# Patient Record
Sex: Male | Born: 1975 | Race: White | Hispanic: No | Marital: Married | Smoking: Current every day smoker
Health system: Southern US, Community
[De-identification: ages and names within clinical notes are randomized; demographics above are authoritative.]

---

## 2005-11-17 ENCOUNTER — Emergency Department (HOSPITAL_COMMUNITY): Admission: EM | Admit: 2005-11-17 | Discharge: 2005-11-18 | Payer: Self-pay | Admitting: Emergency Medicine

## 2006-07-13 ENCOUNTER — Encounter: Admission: RE | Admit: 2006-07-13 | Discharge: 2006-07-13 | Payer: Self-pay | Admitting: Orthopedic Surgery

## 2009-07-24 ENCOUNTER — Ambulatory Visit (HOSPITAL_BASED_OUTPATIENT_CLINIC_OR_DEPARTMENT_OTHER): Admission: RE | Admit: 2009-07-24 | Discharge: 2009-07-24 | Payer: Self-pay | Admitting: Orthopedic Surgery

## 2010-08-19 LAB — POCT HEMOGLOBIN-HEMACUE: Hemoglobin: 16.7 g/dL (ref 13.0–17.0)

## 2016-12-21 ENCOUNTER — Emergency Department (HOSPITAL_COMMUNITY)
Admission: EM | Admit: 2016-12-21 | Discharge: 2016-12-21 | Disposition: A | Discharge status: Home / Self Care | Payer: BLUE CROSS/BLUE SHIELD | Attending: Emergency Medicine | Admitting: Emergency Medicine

## 2016-12-21 ENCOUNTER — Encounter (HOSPITAL_COMMUNITY): Payer: Self-pay | Admitting: Emergency Medicine

## 2016-12-21 ENCOUNTER — Emergency Department (HOSPITAL_COMMUNITY): Payer: BLUE CROSS/BLUE SHIELD

## 2016-12-21 DIAGNOSIS — R1030 Lower abdominal pain, unspecified: Secondary | ICD-10-CM | POA: Diagnosis present

## 2016-12-21 DIAGNOSIS — F17200 Nicotine dependence, unspecified, uncomplicated: Secondary | ICD-10-CM | POA: Diagnosis not present

## 2016-12-21 DIAGNOSIS — K529 Noninfective gastroenteritis and colitis, unspecified: Secondary | ICD-10-CM | POA: Insufficient documentation

## 2016-12-21 DIAGNOSIS — R102 Pelvic and perineal pain: Secondary | ICD-10-CM | POA: Diagnosis not present

## 2016-12-21 LAB — URINALYSIS, ROUTINE W REFLEX MICROSCOPIC
Bilirubin Urine: NEGATIVE
Glucose, UA: NEGATIVE mg/dL
Hgb urine dipstick: NEGATIVE
Ketones, ur: NEGATIVE mg/dL
Leukocytes, UA: NEGATIVE
NITRITE: NEGATIVE
PH: 7 (ref 5.0–8.0)
Protein, ur: NEGATIVE mg/dL
SPECIFIC GRAVITY, URINE: 1.008 (ref 1.005–1.030)

## 2016-12-21 LAB — CBC
HEMATOCRIT: 46.6 % (ref 39.0–52.0)
HEMOGLOBIN: 17.1 g/dL — AB (ref 13.0–17.0)
MCH: 31.5 pg (ref 26.0–34.0)
MCHC: 36.7 g/dL — ABNORMAL HIGH (ref 30.0–36.0)
MCV: 85.8 fL (ref 78.0–100.0)
Platelets: 167 10*3/uL (ref 150–400)
RBC: 5.43 MIL/uL (ref 4.22–5.81)
RDW: 12.4 % (ref 11.5–15.5)
WBC: 10.3 10*3/uL (ref 4.0–10.5)

## 2016-12-21 LAB — COMPREHENSIVE METABOLIC PANEL
ALBUMIN: 3.6 g/dL (ref 3.5–5.0)
ALK PHOS: 56 U/L (ref 38–126)
ALT: 38 U/L (ref 17–63)
ANION GAP: 10 (ref 5–15)
AST: 23 U/L (ref 15–41)
BUN: 7 mg/dL (ref 6–20)
CALCIUM: 9.3 mg/dL (ref 8.9–10.3)
CO2: 29 mmol/L (ref 22–32)
Chloride: 100 mmol/L — ABNORMAL LOW (ref 101–111)
Creatinine, Ser: 0.95 mg/dL (ref 0.61–1.24)
GFR calc non Af Amer: >60 mL/min (ref >60)
Glucose, Bld: 123 mg/dL — ABNORMAL HIGH (ref 65–99)
POTASSIUM: 3.7 mmol/L (ref 3.5–5.1)
SODIUM: 139 mmol/L (ref 135–145)
Total Bilirubin: 0.6 mg/dL (ref 0.3–1.2)
Total Protein: 6.2 g/dL — ABNORMAL LOW (ref 6.5–8.1)

## 2016-12-21 LAB — LIPASE, BLOOD: LIPASE: 60 U/L — AB (ref 11–51)

## 2016-12-21 MED ORDER — CIPROFLOXACIN HCL 500 MG PO TABS
500.0000 mg | ORAL_TABLET | Freq: Once | ORAL | Status: AC
  Administered 2016-12-21: 500 mg via ORAL
  Filled 2016-12-21: qty 1

## 2016-12-21 MED ORDER — MORPHINE SULFATE (PF) 4 MG/ML IV SOLN
4.0000 mg | Freq: Once | INTRAVENOUS | Status: AC
  Administered 2016-12-21: 4 mg via INTRAVENOUS
  Filled 2016-12-21: qty 1

## 2016-12-21 MED ORDER — ONDANSETRON HCL 4 MG/2ML IJ SOLN
4.0000 mg | Freq: Once | INTRAMUSCULAR | Status: AC
  Administered 2016-12-21: 4 mg via INTRAVENOUS
  Filled 2016-12-21: qty 2

## 2016-12-21 MED ORDER — SODIUM CHLORIDE 0.9 % IV BOLUS (SEPSIS)
1000.0000 mL | Freq: Once | INTRAVENOUS | Status: AC
  Administered 2016-12-21: 1000 mL via INTRAVENOUS

## 2016-12-21 MED ORDER — METRONIDAZOLE 500 MG PO TABS
500.0000 mg | ORAL_TABLET | Freq: Once | ORAL | Status: AC
  Administered 2016-12-21: 500 mg via ORAL
  Filled 2016-12-21: qty 1

## 2016-12-21 MED ORDER — METRONIDAZOLE 500 MG PO TABS: 500.0000 mg | ORAL_TABLET | Freq: Three times a day (TID) | ORAL | 0 refills | Status: AC

## 2016-12-21 MED ORDER — ONDANSETRON 4 MG PO TBDP: ORAL_TABLET | ORAL | 0 refills | Status: AC

## 2016-12-21 MED ORDER — ONDANSETRON 4 MG PO TBDP
4.0000 mg | ORAL_TABLET | Freq: Once | ORAL | Status: AC
  Administered 2016-12-21: 4 mg via ORAL
  Filled 2016-12-21: qty 1

## 2016-12-21 MED ORDER — IOPAMIDOL (ISOVUE-300) INJECTION 61%
INTRAVENOUS | Status: AC
  Administered 2016-12-21: 100 mL via INTRAVENOUS
  Filled 2016-12-21: qty 100

## 2016-12-21 MED ORDER — HYDROCODONE-ACETAMINOPHEN 5-325 MG PO TABS: ORAL_TABLET | ORAL | 0 refills | Status: AC

## 2016-12-21 MED ORDER — CIPROFLOXACIN HCL 500 MG PO TABS: 500.0000 mg | ORAL_TABLET | Freq: Two times a day (BID) | ORAL | 0 refills | Status: AC

## 2016-12-21 NOTE — ED Provider Notes (Signed)
[facility]-EMERGENCY DEPT Provider Note   CSN: [id number] Arrival date & time: 12/21/16  [id number]     History   Chief Complaint Chief Complaint  Patient presents with  . Abdominal Pain  . Diarrhea     HPI   Blood pressure 131/86, pulse 72, temperature 98.2 F (36.8 C), temperature source Oral, resp. rate 17, SpO2 100 %.  Frederick Ritter is a 41 y.o. male complaining of moderate-to-severe lower abdominal pain onset 4 days ago. Described as colicky, unclear if it's worse on the right than the left. It's associated with the profuse watery diarrhea, with nausea but without emesis, fever (he does report chills.). Patient states the stool turned darker after he started taking Pepto-Bismol, patient also started taking Excedrin for pain control. He is not an excessive drinker and does not normally take NSAIDs. He has no epigastric abdominal pain. He's never seen a gastroenterologist and has never had a colonoscopy. Positive anorexia. Of note, patient's family received a phone call stating that [employer] goldfish that they thought may have been contaminated with salmonella, it was requested that the remaining goldfish. Return to them however they had artery been eaten. Both this patient and his son had eaten them, son is sick with diarrhea and emesis.   History reviewed. No pertinent past medical history.  There are no active problems to display for this patient.   History reviewed. No pertinent surgical history.     Home Medications    Prior to Admission medications   Medication Sig Start Date End Date Taking? Authorizing Provider  aspirin-acetaminophen-caffeine (EXCEDRIN MIGRAINE) [phone] MG tablet Take 2 tablets by mouth every 6 (six) hours as needed for headache.   Yes [provider]  bismuth subsalicylate (PEPTO BISMOL) 262 MG chewable tablet Chew 524 mg by mouth as needed for indigestion.   Yes [provider]  ciprofloxacin (CIPRO) 500 MG tablet Take 1 tablet (500  mg total) by mouth every 12 (twelve) hours. 12/21/16   Celicia Minahan, Joni ReiningNicole, PA-C  HYDROcodone-acetaminophen (NORCO/VICODIN) 5-325 MG tablet Take 1-2 tablets by mouth every 6 hours as needed for pain. 12/21/16   Shawntell Dixson, Joni ReiningNicole, PA-C  metroNIDAZOLE (FLAGYL) 500 MG tablet Take 1 tablet (500 mg total) by mouth 3 (three) times daily. 12/21/16 12/31/16  Aeralyn Barna, Joni ReiningNicole, PA-C  ondansetron (ZOFRAN ODT) 4 MG disintegrating tablet 4mg  ODT q4 hours prn nausea/vomit 12/21/16   Stpehen Petitjean, Joni ReiningNicole, PA-C    Family History No family history on file.  Social History Social History  Substance Use Topics  . Smoking status: Current Every Day Smoker  . Smokeless tobacco: Current User  . Alcohol use Yes     Allergies   Patient has no known allergies.   Review of Systems Review of Systems  A complete review of systems was obtained and all systems are negative except as noted in the HPI and PMH.    Physical Exam Updated Vital Signs BP 115/76 (BP Location: Right Arm)   Pulse 63   Temp 98.2 F (36.8 C) (Oral)   Resp 16   SpO2 99%   Physical Exam  Constitutional: He is oriented to person, place, and time. He appears well-developed and well-nourished. No distress.  HENT:  Head: Normocephalic and atraumatic.  Mouth/Throat: Oropharynx is clear and moist.  Eyes: Pupils are equal, round, and reactive to light. Conjunctivae and EOM are normal.  Neck: Normal range of motion.  Cardiovascular: Normal rate, regular rhythm and intact distal pulses.   Pulmonary/Chest: Effort normal and breath sounds normal. No respiratory  distress. He has no wheezes. He has no rales. He exhibits no tenderness.  Abdominal: Soft. He exhibits no distension and no mass. There is tenderness. There is no rebound and no guarding. No hernia.  Normal active bowel sounds.   Tender to palpation the left lower quadrant, worse at removal of pressure. Rovsing, psoas, obturator negative. No CVA tenderness to percussion bilaterally.    Musculoskeletal: Normal range of motion.  Neurological: He is alert and oriented to person, place, and time.  Skin: Capillary refill takes less than 2 seconds. He is not diaphoretic.  Psychiatric: He has a normal mood and affect.  Nursing note and vitals reviewed.    ED Treatments / Results  Labs (all labs ordered are listed, but only abnormal results are displayed) Labs Reviewed  LIPASE, BLOOD - Abnormal; Notable for the following:       Result Value   Lipase 60 (*)    All other components within normal limits  COMPREHENSIVE METABOLIC PANEL - Abnormal; Notable for the following:    Chloride 100 (*)    Glucose, Bld 123 (*)    Total Protein 6.2 (*)    All other components within normal limits  CBC - Abnormal; Notable for the following:    Hemoglobin 17.1 (*)    MCHC 36.7 (*)    All other components within normal limits  GASTROINTESTINAL PANEL BY PCR, STOOL (REPLACES STOOL CULTURE)  URINALYSIS, ROUTINE W REFLEX MICROSCOPIC    EKG  EKG Interpretation None       Radiology Ct Abdomen Pelvis W Contrast  Result Date: 12/21/2016 CLINICAL DATA:  Acute pelvic pain. EXAM: CT ABDOMEN AND PELVIS WITH CONTRAST TECHNIQUE: Multidetector CT imaging of the abdomen and pelvis was performed using the standard protocol following bolus administration of intravenous contrast. CONTRAST:  100mL ISOVUE-300 IOPAMIDOL (ISOVUE-300) INJECTION 61% COMPARISON:  None. FINDINGS: Lower chest: No acute abnormality. Hepatobiliary: No focal liver abnormality is seen. No gallstones, gallbladder wall thickening, or biliary dilatation. Pancreas: Unremarkable. No pancreatic ductal dilatation or surrounding inflammatory changes. Spleen: Normal in size without focal abnormality. Adrenals/Urinary Tract: Adrenal glands are unremarkable. Kidneys are normal, without renal calculi, focal lesion, or hydronephrosis. Bladder is unremarkable. Stomach/Bowel: Mild wall thickening is seen involving the distal small bowel with  enhancement, most consistent with enteritis or other bowel inflammation. Crohn's disease cannot be excluded. The appendix appears normal. The stomach appears normal. Possible mild wall thickening and minimal surrounding inflammatory changes of the colon is noted suggesting colitis. There is no evidence of bowel obstruction. Vascular/Lymphatic: No significant vascular findings are present. No enlarged abdominal or pelvic lymph nodes. Reproductive: Prostate is unremarkable. Other: Small amount of free fluid is noted in the dependent portion of the pelvis. No hernia is noted. Musculoskeletal: No acute or significant osseous findings. IMPRESSION: Mild wall thickening and enhancement is seen involving the distal small bowel, most consistent with enteritis or possibly inflammatory bowel disease such as Crohn's disease. Possible mild wall thickening of the colon with minimal surrounding inflammatory changes is also noted suggesting inflammatory or infectious colitis. Electronically Signed   By: Lupita RaiderJames  Green Jr, M.D.   On: 12/21/2016 14:45    Procedures Procedures (including critical care time)  Medications Ordered in ED Medications  ondansetron (ZOFRAN-ODT) disintegrating tablet 4 mg (not administered)  ciprofloxacin (CIPRO) tablet 500 mg (not administered)  metroNIDAZOLE (FLAGYL) tablet 500 mg (not administered)  sodium chloride 0.9 % bolus 1,000 mL (0 mLs Intravenous Stopped 12/21/16 1509)  morphine 4 MG/ML injection 4 mg (4 mg  Intravenous Given 12/21/16 1330)  ondansetron (ZOFRAN) injection 4 mg (4 mg Intravenous Given 12/21/16 1326)  iopamidol (ISOVUE-300) 61 % injection (100 mLs Intravenous Contrast Given 12/21/16 1410)     Initial Impression / Assessment and Plan / ED Course  I have reviewed the triage vital signs and the nursing notes.  Pertinent labs & imaging results that were available during my care of the patient were reviewed by me and considered in my medical decision making (see chart for  details).     Vitals:   12/21/16 1345 12/21/16 1400 12/21/16 1430 12/21/16 1510  BP: 119/75 127/78  115/76  Pulse: 72 74 69 63  Resp:    16  Temp:      TempSrc:      SpO2: 100% 99% 100% 99%    Medications  ondansetron (ZOFRAN-ODT) disintegrating tablet 4 mg (not administered)  ciprofloxacin (CIPRO) tablet 500 mg (not administered)  metroNIDAZOLE (FLAGYL) tablet 500 mg (not administered)  sodium chloride 0.9 % bolus 1,000 mL (0 mLs Intravenous Stopped 12/21/16 1509)  morphine 4 MG/ML injection 4 mg (4 mg Intravenous Given 12/21/16 1330)  ondansetron (ZOFRAN) injection 4 mg (4 mg Intravenous Given 12/21/16 1326)  iopamidol (ISOVUE-300) 61 % injection (100 mLs Intravenous Contrast Given 12/21/16 1410)    Frederick Ritter is 41 y.o. male presenting with bilateral lower abdominal pain with associated watery diarrhea  onset 4 days ago while he was traveling. Abdominal exam is nonsurgical, he is tender in both the right and left lower quadrant. Blood work reassuring with no leukocytosis and left hemoconcentrated. Nonspecific elevation in lipase.  CT abdomen pelvis shows a nonspecific inflammation in the distal small bowel and colon. Will cover for an infectious colitis with Cipro and Flagyl, this will also cover Salmonella. Patient's stool by PCR pending. Will be given GI referral, extensive discussion of return precautions.  Evaluation does not show pathology that would require ongoing emergent intervention or inpatient treatment. Pt is hemodynamically stable and mentating appropriately. Discussed findings and plan with patient/guardian, who agrees with care plan. All questions answered. Return precautions discussed and outpatient follow up given.    Final Clinical Impressions(s) / ED Diagnoses   Final diagnoses:  Colitis    New Prescriptions New Prescriptions   CIPROFLOXACIN (CIPRO) 500 MG TABLET    Take 1 tablet (500 mg total) by mouth every 12 (twelve) hours.   HYDROCODONE-ACETAMINOPHEN  (NORCO/VICODIN) 5-325 MG TABLET    Take 1-2 tablets by mouth every 6 hours as needed for pain.   METRONIDAZOLE (FLAGYL) 500 MG TABLET    Take 1 tablet (500 mg total) by mouth 3 (three) times daily.   ONDANSETRON (ZOFRAN ODT) 4 MG DISINTEGRATING TABLET    4mg  ODT q4 hours prn nausea/vomit     Madasyn Heath, Mardella Layman 12/21/16 1539    Charlynne Pander, MD 12/22/16 [phone]

## 2016-12-21 NOTE — ED Notes (Signed)
Pt and wife state they undestand insatructions. Home stable with wife.

## 2016-12-21 NOTE — ED Notes (Signed)
Pt unable to provide stool specimen

## 2016-12-21 NOTE — ED Triage Notes (Signed)
Pt. Stated, I've had stomach pain lower , I will have periods of sharpe pain that spike and let off.  Ive been nauseated with diarrhea straight water.  No appetite.  This started Thursday.

## 2016-12-21 NOTE — Discharge Instructions (Signed)
You can maintain a full liquid diet for 3 days if you choose to.  Take vicodin for breakthrough pain, do not drink alcohol, drive, care for children or do other critical tasks while taking vicodin.  Take your antibiotics as directed and to completion. You should never have any leftover antibiotics! Push fluids and stay well hydrated.   Do not drink alcohol while you are taking flagyl (metronidazole) because it will make you very sick.  Please take probiotics that you can get over-the-counter, asked your pharmacist for recommendations if needed.

## 2016-12-24 ENCOUNTER — Other Ambulatory Visit: Payer: Self-pay | Admitting: Gastroenterology

## 2016-12-24 DIAGNOSIS — K529 Noninfective gastroenteritis and colitis, unspecified: Secondary | ICD-10-CM

## 2017-01-28 ENCOUNTER — Ambulatory Visit
Admission: RE | Admit: 2017-01-28 | Discharge: 2017-01-28 | Disposition: A | Discharge status: Home / Self Care | Payer: BLUE CROSS/BLUE SHIELD | Source: Ambulatory Visit | Attending: Gastroenterology | Admitting: Gastroenterology

## 2017-01-28 DIAGNOSIS — K529 Noninfective gastroenteritis and colitis, unspecified: Secondary | ICD-10-CM

## 2017-01-28 MED ORDER — IOPAMIDOL (ISOVUE-300) INJECTION 61%
100.0000 mL | Freq: Once | INTRAVENOUS | Status: AC | PRN
  Administered 2017-01-28: 100 mL via INTRAVENOUS

## 2017-02-03 DIAGNOSIS — K529 Noninfective gastroenteritis and colitis, unspecified: Secondary | ICD-10-CM | POA: Diagnosis not present

## 2017-02-03 DIAGNOSIS — R194 Change in bowel habit: Secondary | ICD-10-CM | POA: Diagnosis not present

## 2017-03-17 DIAGNOSIS — D2230 Melanocytic nevi of unspecified part of face: Secondary | ICD-10-CM | POA: Diagnosis not present

## 2017-03-17 DIAGNOSIS — L281 Prurigo nodularis: Secondary | ICD-10-CM | POA: Diagnosis not present

## 2017-03-17 DIAGNOSIS — Z86018 Personal history of other benign neoplasm: Secondary | ICD-10-CM | POA: Diagnosis not present

## 2017-03-17 DIAGNOSIS — D2261 Melanocytic nevi of right upper limb, including shoulder: Secondary | ICD-10-CM | POA: Diagnosis not present

## 2017-03-17 DIAGNOSIS — D485 Neoplasm of uncertain behavior of skin: Secondary | ICD-10-CM | POA: Diagnosis not present

## 2017-03-17 DIAGNOSIS — D225 Melanocytic nevi of trunk: Secondary | ICD-10-CM | POA: Diagnosis not present

## 2018-03-11 DIAGNOSIS — E559 Vitamin D deficiency, unspecified: Secondary | ICD-10-CM | POA: Diagnosis not present

## 2018-03-11 DIAGNOSIS — Z125 Encounter for screening for malignant neoplasm of prostate: Secondary | ICD-10-CM | POA: Diagnosis not present

## 2018-03-11 DIAGNOSIS — Z0000 Encounter for general adult medical examination without abnormal findings: Secondary | ICD-10-CM | POA: Diagnosis not present

## 2018-03-18 DIAGNOSIS — G4709 Other insomnia: Secondary | ICD-10-CM | POA: Diagnosis not present

## 2018-03-18 DIAGNOSIS — R030 Elevated blood-pressure reading, without diagnosis of hypertension: Secondary | ICD-10-CM | POA: Diagnosis not present

## 2018-03-18 DIAGNOSIS — Z0000 Encounter for general adult medical examination without abnormal findings: Secondary | ICD-10-CM | POA: Diagnosis not present

## 2018-03-18 DIAGNOSIS — Z720 Tobacco use: Secondary | ICD-10-CM | POA: Diagnosis not present

## 2018-03-18 DIAGNOSIS — R5383 Other fatigue: Secondary | ICD-10-CM | POA: Diagnosis not present

## 2018-03-18 DIAGNOSIS — Z1389 Encounter for screening for other disorder: Secondary | ICD-10-CM | POA: Diagnosis not present

## 2018-03-22 DIAGNOSIS — D2261 Melanocytic nevi of right upper limb, including shoulder: Secondary | ICD-10-CM | POA: Diagnosis not present

## 2018-03-22 DIAGNOSIS — L281 Prurigo nodularis: Secondary | ICD-10-CM | POA: Diagnosis not present

## 2018-03-22 DIAGNOSIS — Z23 Encounter for immunization: Secondary | ICD-10-CM | POA: Diagnosis not present

## 2018-03-22 DIAGNOSIS — D225 Melanocytic nevi of trunk: Secondary | ICD-10-CM | POA: Diagnosis not present

## 2018-03-22 DIAGNOSIS — D485 Neoplasm of uncertain behavior of skin: Secondary | ICD-10-CM | POA: Diagnosis not present

## 2018-12-17 IMAGING — CT CT ABD-PELV W/ CM
2 of 5 series · 16 of 46 positions shown, 18 images · IV contrast (APPLIED)
Comparison: None.

CLINICAL DATA: Acute pelvic pain.

EXAM:
CT ABDOMEN AND PELVIS WITH CONTRAST
TECHNIQUE: Multidetector CT imaging of the abdomen and pelvis was performed
using the standard protocol following bolus administration of
intravenous contrast.
CONTRAST:  100mL [id number] IOPAMIDOL (B4O4PU-E33) INJECTION 61%

[Series 3: abdomen 5.0 · axial · 0.80mm/px · z∈[+735,+1155]mm · 13 of 99 slices shown, 15 images]
[im 8/99  soft-tissue]
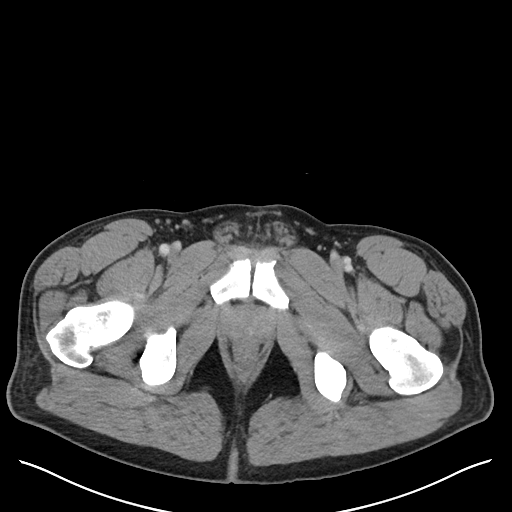
[im 8/99  bone]
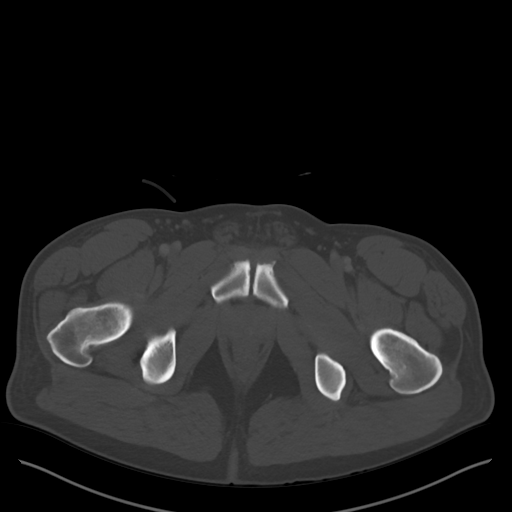
[im 15/99  soft-tissue]
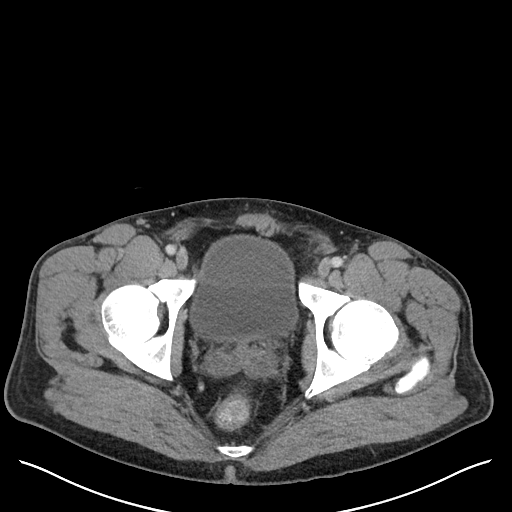
[im 22/99  soft-tissue]
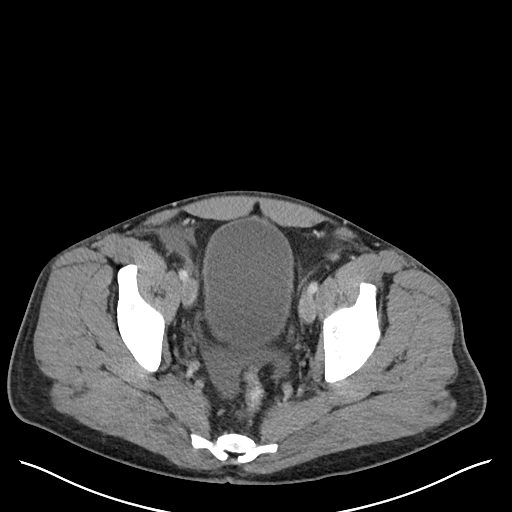
[im 29/99  soft-tissue]
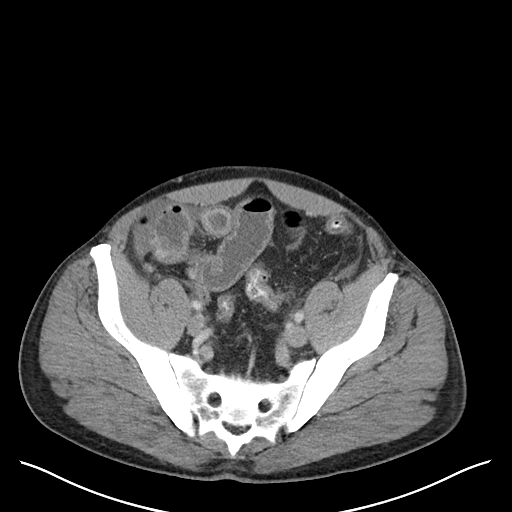
[im 36/99  soft-tissue]
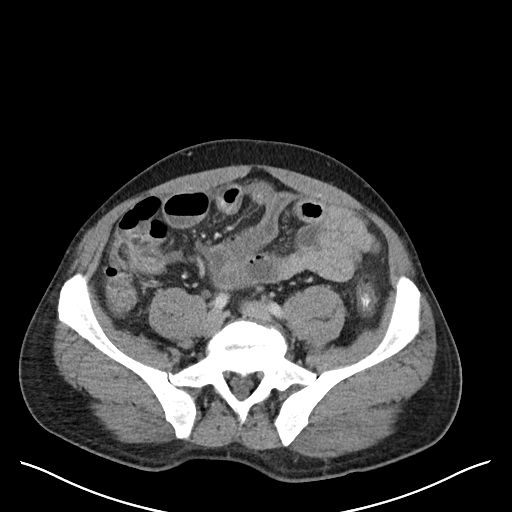
[im 43/99  soft-tissue]
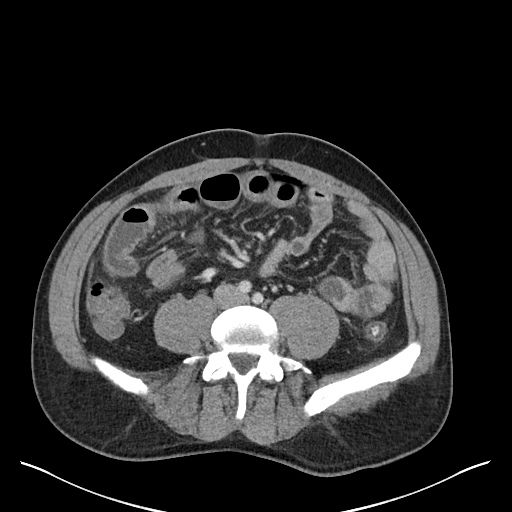
[im 50/99  soft-tissue]
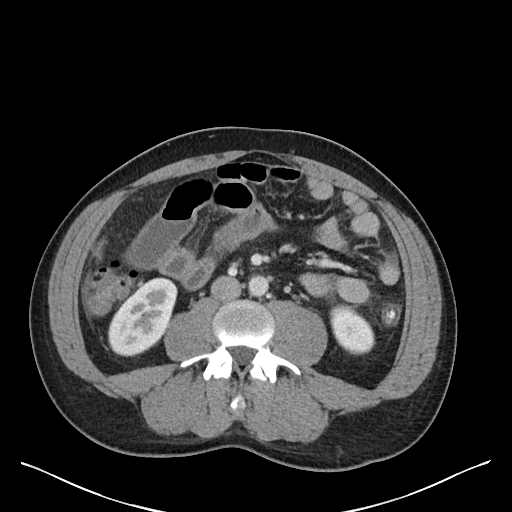
[im 57/99  soft-tissue]
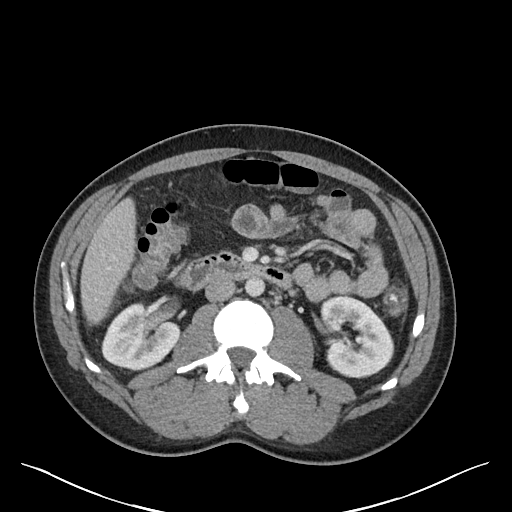
[im 64/99  soft-tissue]
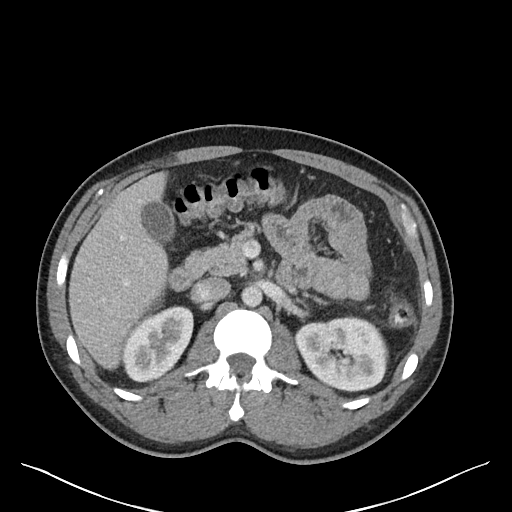
[im 64/99  bone]
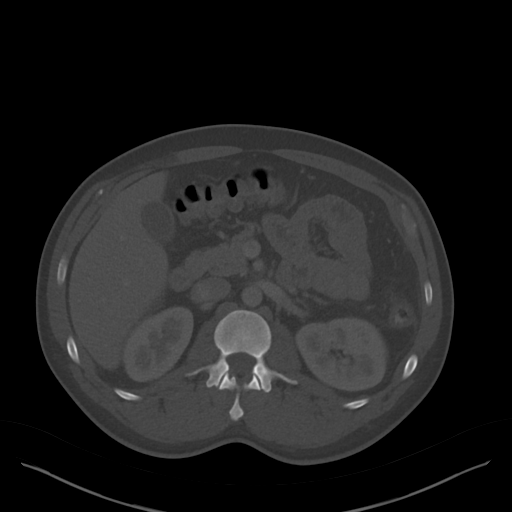
[im 71/99  soft-tissue]
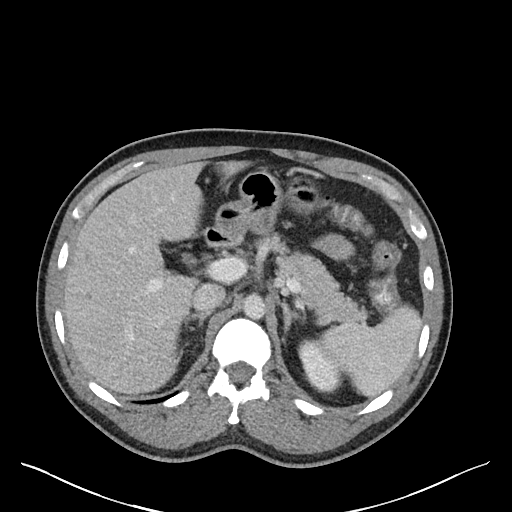
[im 78/99  soft-tissue]
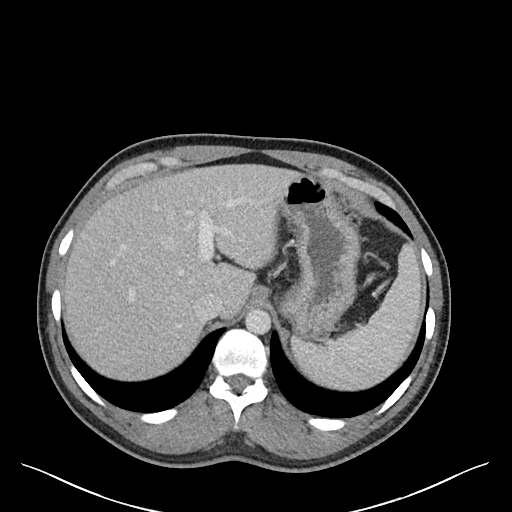
[im 85/99  soft-tissue]
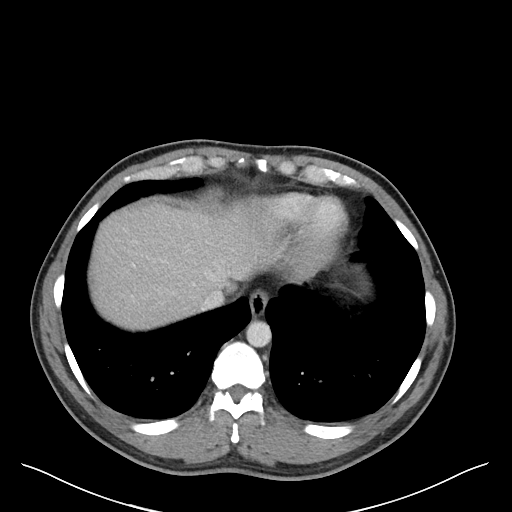
[im 92/99  soft-tissue]
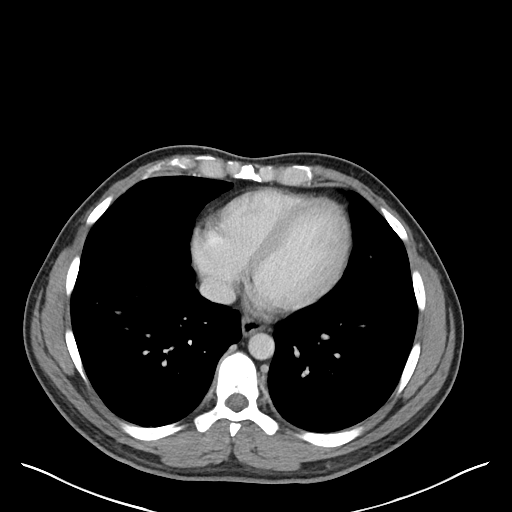

[Series 6: abdomen 3.0 mpr cor · coronal · 0.91mm/px · 3 of 100 slices shown]
[im 34/100  soft-tissue]
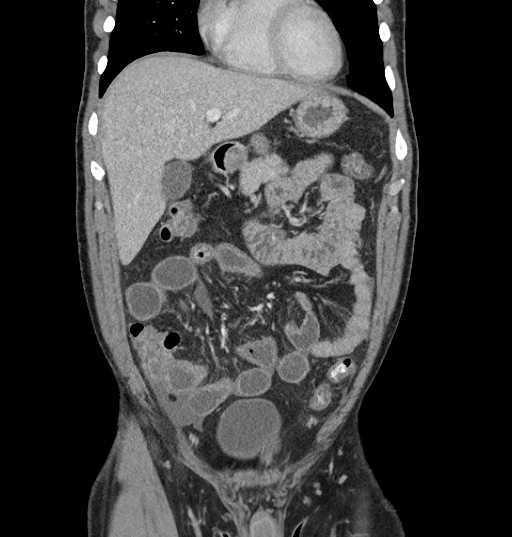
[im 45/100  soft-tissue]
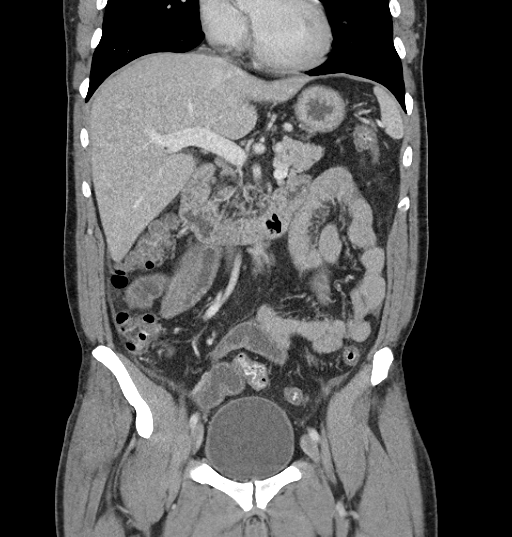
[im 56/100  soft-tissue]
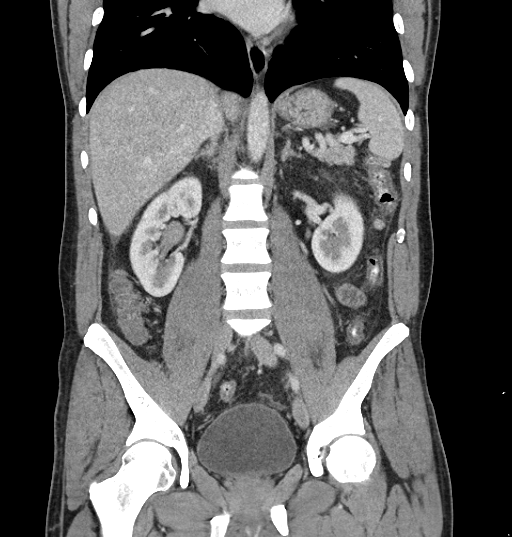

[16 of 46 positions shown; findings below may reference images not displayed]

FINDINGS: Lower chest: No acute abnormality.

Hepatobiliary: No focal liver abnormality is seen. No gallstones,
gallbladder wall thickening, or biliary dilatation.

Pancreas: Unremarkable. No pancreatic ductal dilatation or
surrounding inflammatory changes.

Spleen: Normal in size without focal abnormality.

Adrenals/Urinary Tract: Adrenal glands are unremarkable. Kidneys are
normal, without renal calculi, focal lesion, or hydronephrosis.
Bladder is unremarkable.

Stomach/Bowel: Mild wall thickening is seen involving the distal
small bowel with enhancement, most consistent with enteritis or
other bowel inflammation. Crohn's disease cannot be excluded. The
appendix appears normal. The stomach appears normal. Possible mild
wall thickening and minimal surrounding inflammatory changes of the
colon is noted suggesting colitis. There is no evidence of bowel
obstruction.

Vascular/Lymphatic: No significant vascular findings are present. No
enlarged abdominal or pelvic lymph nodes.

Reproductive: Prostate is unremarkable.

Other: Small amount of free fluid is noted in the dependent portion
of the pelvis. No hernia is noted.

Musculoskeletal: No acute or significant osseous findings.
IMPRESSION: Mild wall thickening and enhancement is seen involving the distal
small bowel, most consistent with enteritis or possibly inflammatory
bowel disease such as Crohn's disease. Possible mild wall thickening
of the colon with minimal surrounding inflammatory changes is also
noted suggesting inflammatory or infectious colitis.

## 2019-05-24 ENCOUNTER — Ambulatory Visit: Payer: BC Managed Care – PPO | Attending: Internal Medicine

## 2019-05-24 DIAGNOSIS — Z20822 Contact with and (suspected) exposure to covid-19: Secondary | ICD-10-CM

## 2019-05-24 DIAGNOSIS — Z20828 Contact with and (suspected) exposure to other viral communicable diseases: Secondary | ICD-10-CM | POA: Diagnosis not present

## 2019-05-25 LAB — NOVEL CORONAVIRUS, NAA: SARS-CoV-2, NAA: NOT DETECTED

## 2019-06-27 DIAGNOSIS — T1501XA Foreign body in cornea, right eye, initial encounter: Secondary | ICD-10-CM | POA: Diagnosis not present

## 2019-07-01 DIAGNOSIS — T1501XD Foreign body in cornea, right eye, subsequent encounter: Secondary | ICD-10-CM | POA: Diagnosis not present

## 2020-03-07 DIAGNOSIS — Z0000 Encounter for general adult medical examination without abnormal findings: Secondary | ICD-10-CM | POA: Diagnosis not present

## 2020-03-07 DIAGNOSIS — E782 Mixed hyperlipidemia: Secondary | ICD-10-CM | POA: Diagnosis not present

## 2020-03-07 DIAGNOSIS — R5383 Other fatigue: Secondary | ICD-10-CM | POA: Diagnosis not present

## 2020-06-28 DIAGNOSIS — D485 Neoplasm of uncertain behavior of skin: Secondary | ICD-10-CM | POA: Diagnosis not present

## 2020-06-28 DIAGNOSIS — L281 Prurigo nodularis: Secondary | ICD-10-CM | POA: Diagnosis not present

## 2020-06-28 DIAGNOSIS — L720 Epidermal cyst: Secondary | ICD-10-CM | POA: Diagnosis not present

## 2020-08-29 DIAGNOSIS — E785 Hyperlipidemia, unspecified: Secondary | ICD-10-CM | POA: Diagnosis not present

## 2020-08-29 DIAGNOSIS — E559 Vitamin D deficiency, unspecified: Secondary | ICD-10-CM | POA: Diagnosis not present

## 2020-08-29 DIAGNOSIS — Z0000 Encounter for general adult medical examination without abnormal findings: Secondary | ICD-10-CM | POA: Diagnosis not present

## 2020-08-29 DIAGNOSIS — Z125 Encounter for screening for malignant neoplasm of prostate: Secondary | ICD-10-CM | POA: Diagnosis not present

## 2020-09-03 DIAGNOSIS — R82998 Other abnormal findings in urine: Secondary | ICD-10-CM | POA: Diagnosis not present

## 2020-09-03 DIAGNOSIS — R899 Unspecified abnormal finding in specimens from other organs, systems and tissues: Secondary | ICD-10-CM | POA: Diagnosis not present

## 2020-09-03 DIAGNOSIS — R030 Elevated blood-pressure reading, without diagnosis of hypertension: Secondary | ICD-10-CM | POA: Diagnosis not present

## 2020-09-03 DIAGNOSIS — Z0000 Encounter for general adult medical examination without abnormal findings: Secondary | ICD-10-CM | POA: Diagnosis not present

## 2020-09-03 DIAGNOSIS — N529 Male erectile dysfunction, unspecified: Secondary | ICD-10-CM | POA: Diagnosis not present

## 2020-09-03 DIAGNOSIS — R5383 Other fatigue: Secondary | ICD-10-CM | POA: Diagnosis not present

## 2020-10-04 DIAGNOSIS — D2262 Melanocytic nevi of left upper limb, including shoulder: Secondary | ICD-10-CM | POA: Diagnosis not present

## 2020-10-04 DIAGNOSIS — D485 Neoplasm of uncertain behavior of skin: Secondary | ICD-10-CM | POA: Diagnosis not present

## 2020-10-04 DIAGNOSIS — L578 Other skin changes due to chronic exposure to nonionizing radiation: Secondary | ICD-10-CM | POA: Diagnosis not present

## 2020-10-04 DIAGNOSIS — D225 Melanocytic nevi of trunk: Secondary | ICD-10-CM | POA: Diagnosis not present

## 2021-01-01 DIAGNOSIS — D485 Neoplasm of uncertain behavior of skin: Secondary | ICD-10-CM | POA: Diagnosis not present

## 2021-01-01 DIAGNOSIS — D2262 Melanocytic nevi of left upper limb, including shoulder: Secondary | ICD-10-CM | POA: Diagnosis not present

## 2021-04-29 ENCOUNTER — Other Ambulatory Visit: Payer: Self-pay | Admitting: Family Medicine

## 2021-04-29 DIAGNOSIS — R1011 Right upper quadrant pain: Secondary | ICD-10-CM | POA: Diagnosis not present

## 2021-05-07 ENCOUNTER — Ambulatory Visit
Admission: RE | Admit: 2021-05-07 | Discharge: 2021-05-07 | Disposition: A | Discharge status: Home / Self Care | Payer: BC Managed Care – PPO | Source: Ambulatory Visit | Attending: Family Medicine | Admitting: Family Medicine

## 2021-05-07 DIAGNOSIS — K8020 Calculus of gallbladder without cholecystitis without obstruction: Secondary | ICD-10-CM | POA: Diagnosis not present

## 2021-05-07 DIAGNOSIS — R1011 Right upper quadrant pain: Secondary | ICD-10-CM

## 2021-05-07 DIAGNOSIS — K824 Cholesterolosis of gallbladder: Secondary | ICD-10-CM | POA: Diagnosis not present

## 2021-05-24 DIAGNOSIS — K8010 Calculus of gallbladder with chronic cholecystitis without obstruction: Secondary | ICD-10-CM | POA: Diagnosis not present

## 2021-05-24 DIAGNOSIS — K824 Cholesterolosis of gallbladder: Secondary | ICD-10-CM | POA: Diagnosis not present

## 2021-06-21 DIAGNOSIS — K811 Chronic cholecystitis: Secondary | ICD-10-CM | POA: Diagnosis not present

## 2021-06-21 DIAGNOSIS — K8010 Calculus of gallbladder with chronic cholecystitis without obstruction: Secondary | ICD-10-CM | POA: Diagnosis not present

## 2021-10-04 DIAGNOSIS — D2261 Melanocytic nevi of right upper limb, including shoulder: Secondary | ICD-10-CM | POA: Diagnosis not present

## 2021-10-04 DIAGNOSIS — D225 Melanocytic nevi of trunk: Secondary | ICD-10-CM | POA: Diagnosis not present

## 2021-10-04 DIAGNOSIS — D485 Neoplasm of uncertain behavior of skin: Secondary | ICD-10-CM | POA: Diagnosis not present

## 2021-10-04 DIAGNOSIS — L905 Scar conditions and fibrosis of skin: Secondary | ICD-10-CM | POA: Diagnosis not present

## 2021-10-04 DIAGNOSIS — D2222 Melanocytic nevi of left ear and external auricular canal: Secondary | ICD-10-CM | POA: Diagnosis not present

## 2021-10-04 DIAGNOSIS — L578 Other skin changes due to chronic exposure to nonionizing radiation: Secondary | ICD-10-CM | POA: Diagnosis not present

## 2022-03-27 DIAGNOSIS — L988 Other specified disorders of the skin and subcutaneous tissue: Secondary | ICD-10-CM | POA: Diagnosis not present

## 2022-03-27 DIAGNOSIS — D485 Neoplasm of uncertain behavior of skin: Secondary | ICD-10-CM | POA: Diagnosis not present

## 2022-10-14 DIAGNOSIS — L720 Epidermal cyst: Secondary | ICD-10-CM | POA: Diagnosis not present

## 2022-10-14 DIAGNOSIS — D225 Melanocytic nevi of trunk: Secondary | ICD-10-CM | POA: Diagnosis not present

## 2022-10-14 DIAGNOSIS — D485 Neoplasm of uncertain behavior of skin: Secondary | ICD-10-CM | POA: Diagnosis not present

## 2022-10-14 DIAGNOSIS — L578 Other skin changes due to chronic exposure to nonionizing radiation: Secondary | ICD-10-CM | POA: Diagnosis not present

## 2022-10-14 DIAGNOSIS — L821 Other seborrheic keratosis: Secondary | ICD-10-CM | POA: Diagnosis not present

## 2022-10-14 DIAGNOSIS — L281 Prurigo nodularis: Secondary | ICD-10-CM | POA: Diagnosis not present

## 2023-03-23 DIAGNOSIS — R030 Elevated blood-pressure reading, without diagnosis of hypertension: Secondary | ICD-10-CM | POA: Diagnosis not present

## 2023-04-28 DIAGNOSIS — E785 Hyperlipidemia, unspecified: Secondary | ICD-10-CM | POA: Diagnosis not present

## 2023-04-28 DIAGNOSIS — Z125 Encounter for screening for malignant neoplasm of prostate: Secondary | ICD-10-CM | POA: Diagnosis not present

## 2023-04-28 DIAGNOSIS — E559 Vitamin D deficiency, unspecified: Secondary | ICD-10-CM | POA: Diagnosis not present

## 2023-05-03 IMAGING — US US ABDOMEN LIMITED
1 series · 14 of 25 positions shown · non-contrast
Comparison: CT 01/28/2017

CLINICAL DATA: Right upper quadrant pain

EXAM:
ULTRASOUND ABDOMEN LIMITED RIGHT UPPER QUADRANT

[Series 1: us abdomen limited · 0.25mm/px · 14 of 94 slices shown]
[im 1/94]
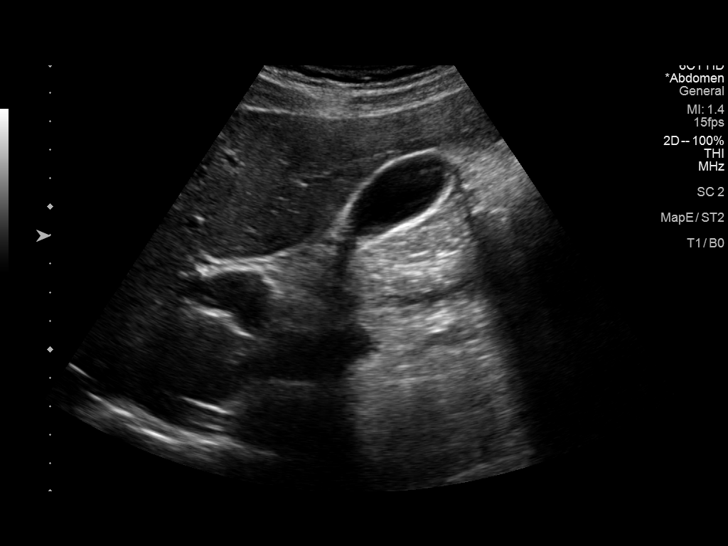
[im 8/94]
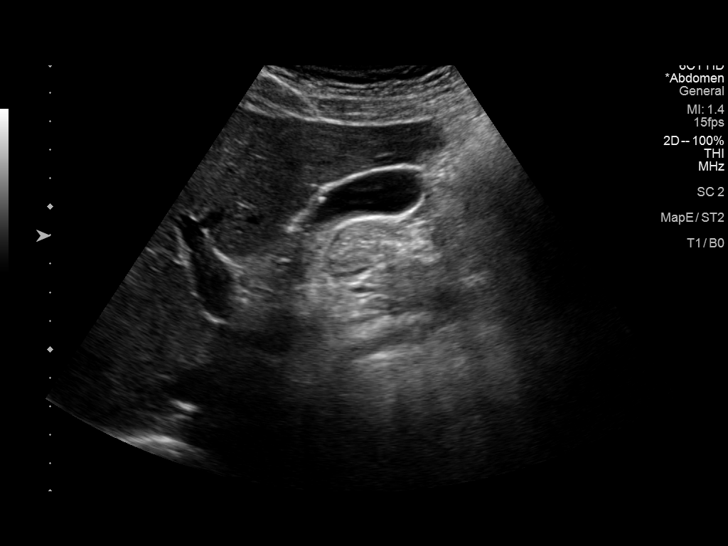
[im 16/94]
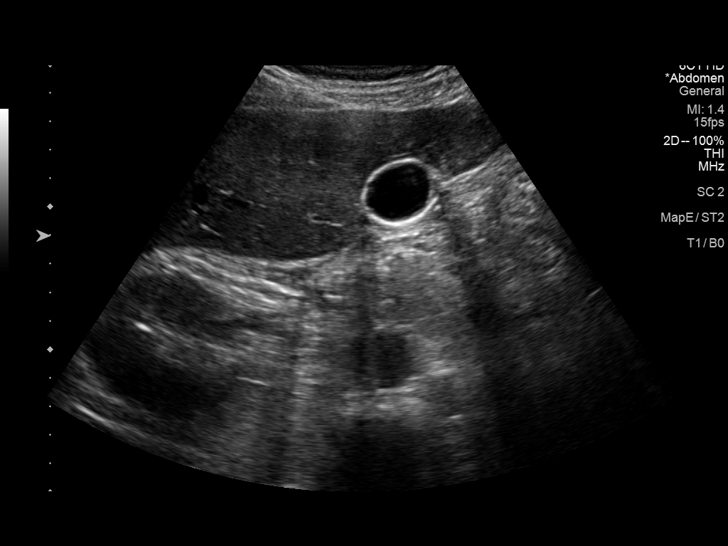
[im 24/94]
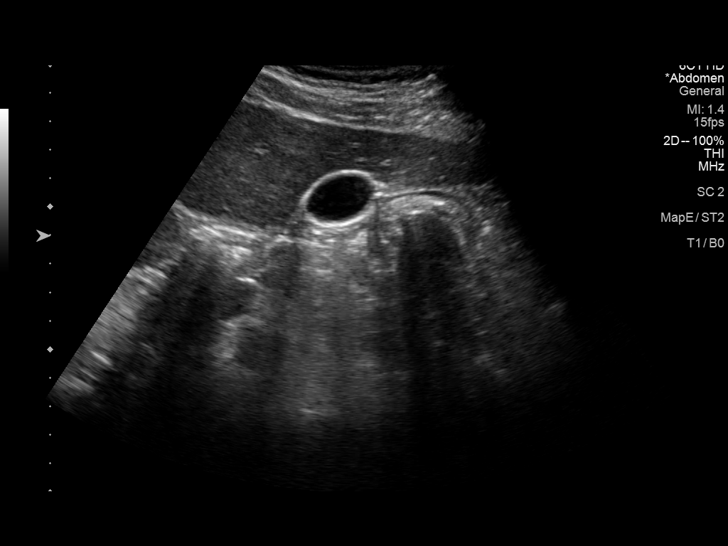
[im 32/94]
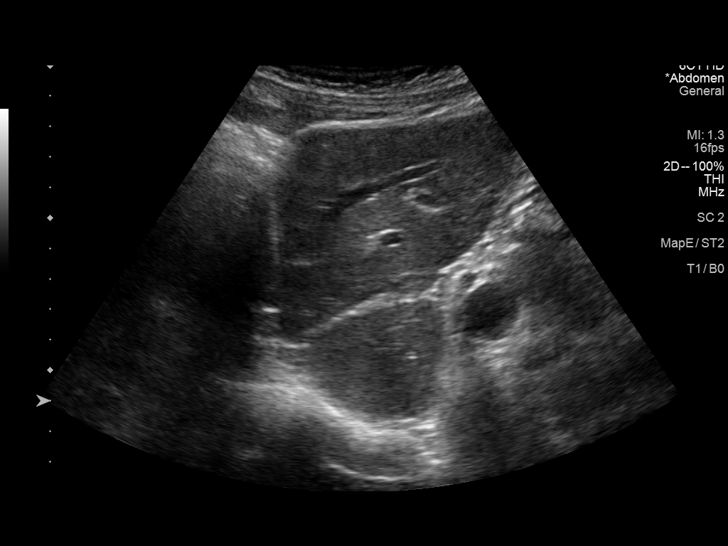
[im 35/94]
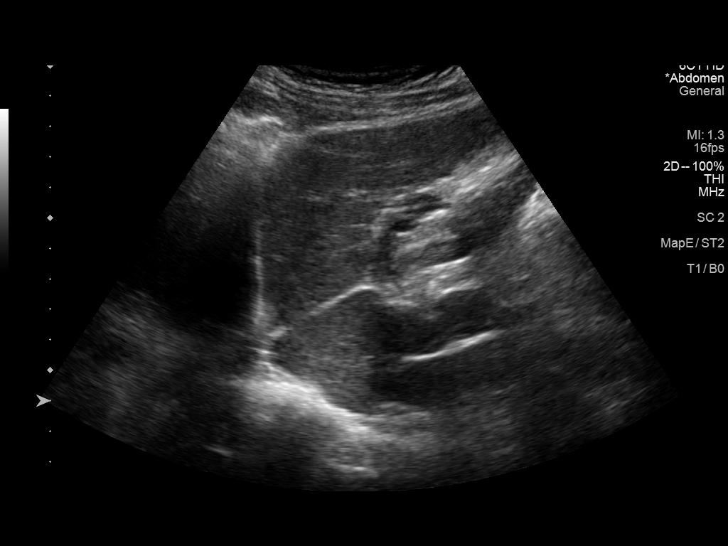
[im 43/94]
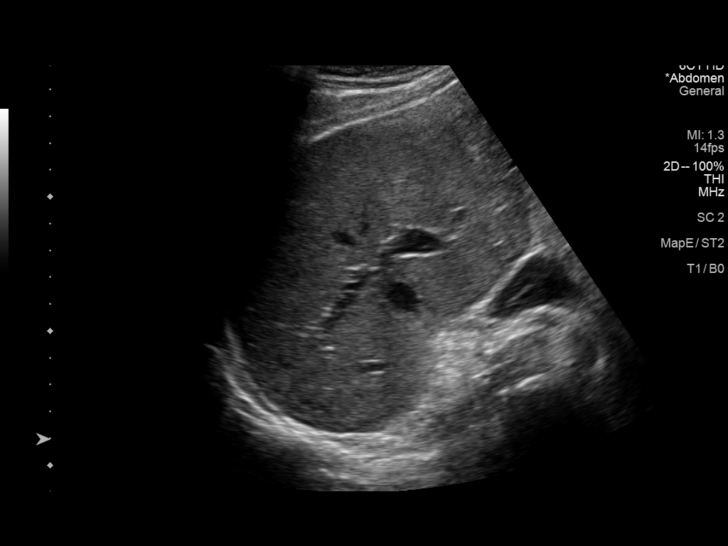
[im 51/94]
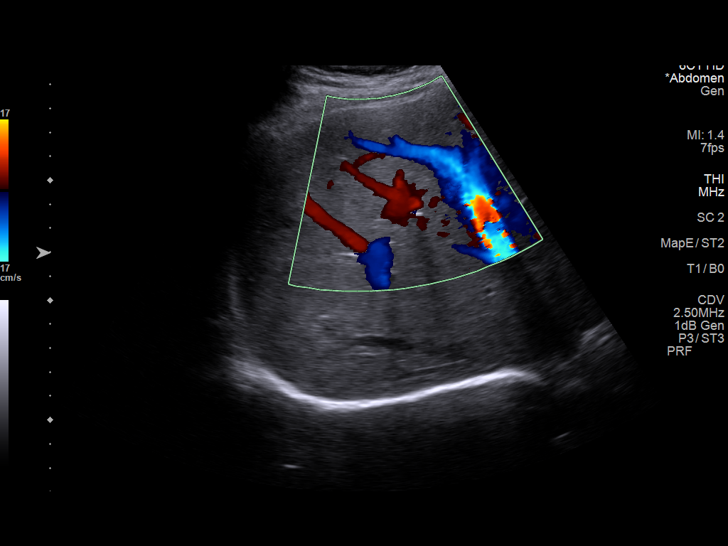
[im 59/94]
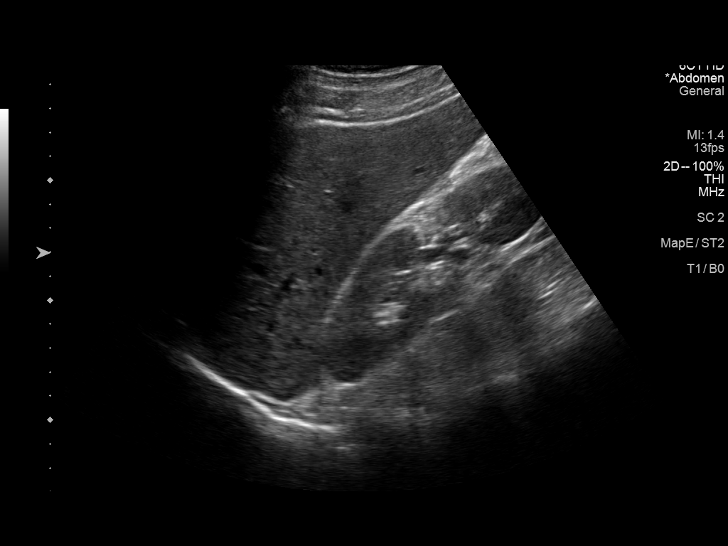
[im 63/94]
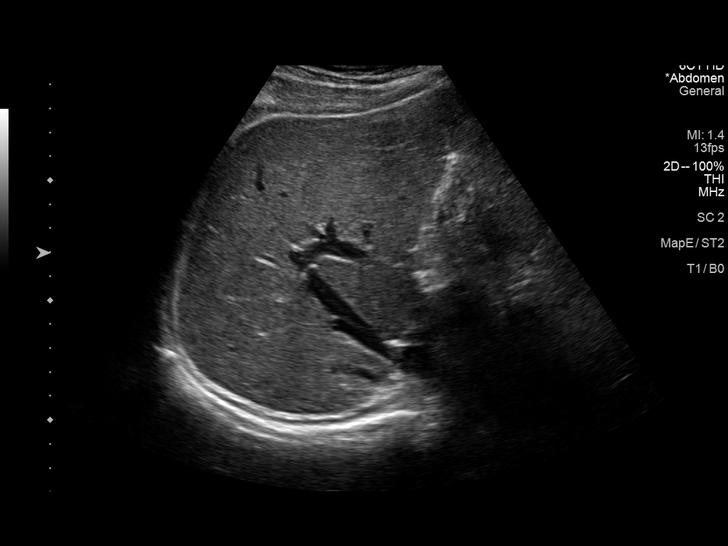
[im 70/94]
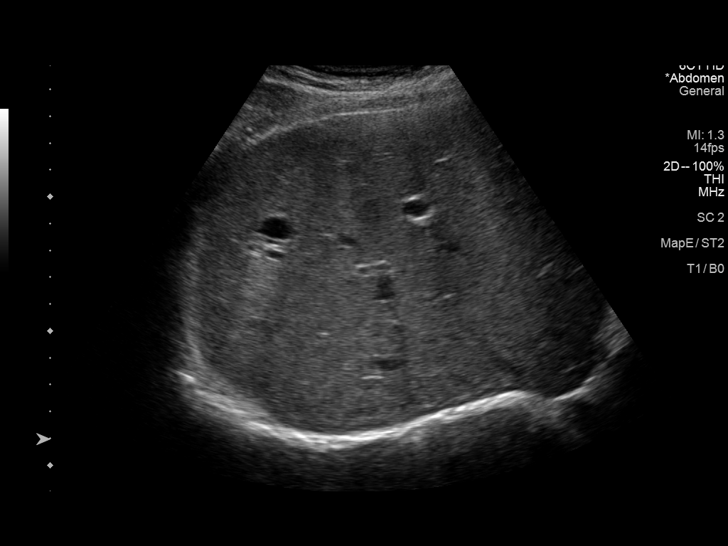
[im 78/94]
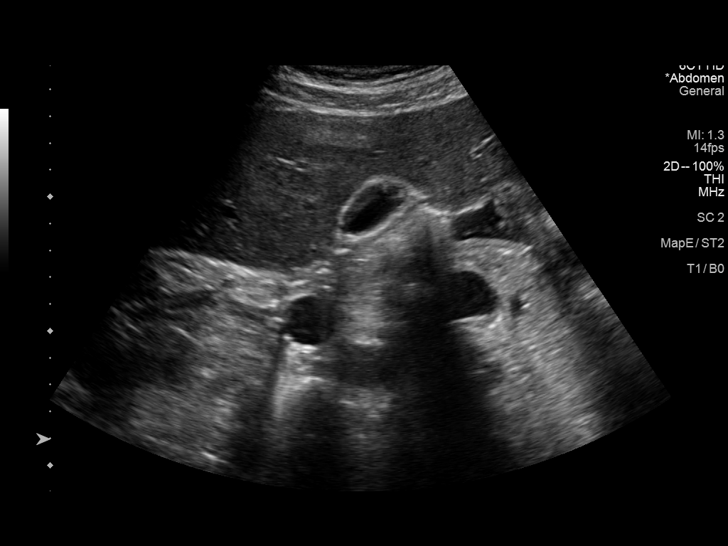
[im 86/94]
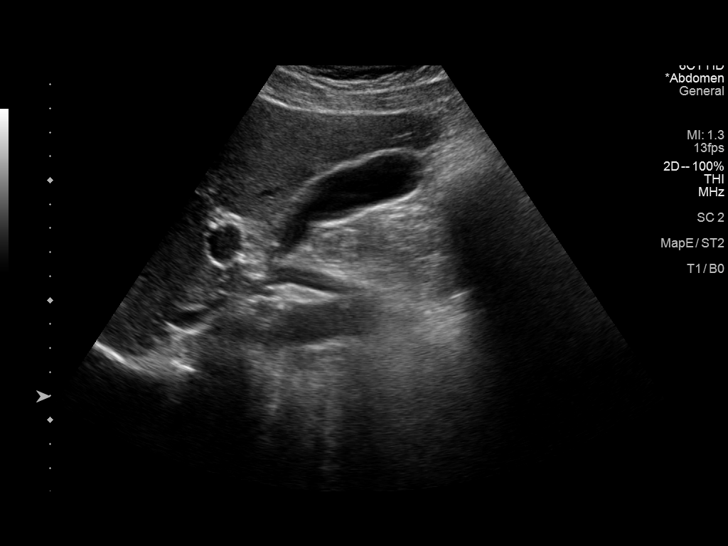
[im 94/94]
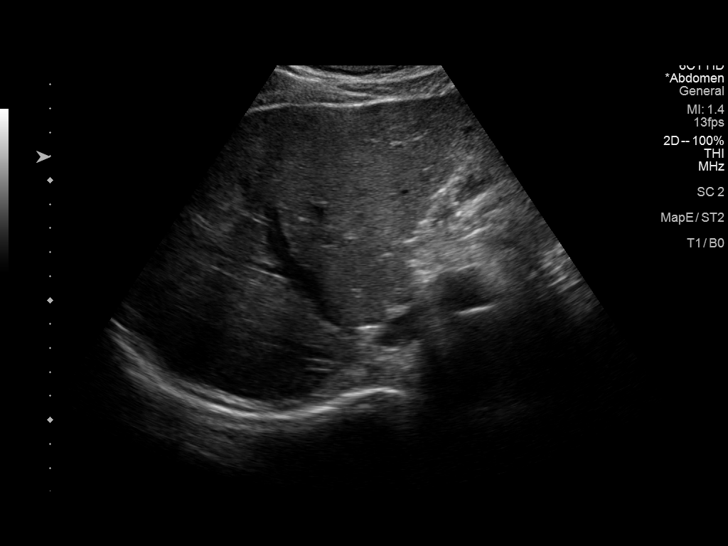

[14 of 25 positions shown; findings below may reference images not displayed]

FINDINGS: Gallbladder:

Small gallstones. Trace gallbladder sludge. Small polyp measuring up
to 5 mm. Normal wall thickness. Negative sonographic Murphy.

Common bile duct:

Diameter: 5 mm

Liver:

Heterogeneous echotexture with some areas of increased echogenicity.
Small cysts in the right hepatic lobe measuring up to 1.7 cm. Portal
vein is patent on color Doppler imaging with normal direction of
blood flow towards the liver.

Other: None.
IMPRESSION: 1. Small gallstones without sonographic evidence for acute
cholecystitis. Small gallbladder polyp
2. Slightly heterogeneous hepatic echotexture, could be secondary to
heterogeneous fat infiltration

## 2023-05-04 DIAGNOSIS — R82998 Other abnormal findings in urine: Secondary | ICD-10-CM | POA: Diagnosis not present

## 2023-05-04 DIAGNOSIS — N529 Male erectile dysfunction, unspecified: Secondary | ICD-10-CM | POA: Diagnosis not present

## 2023-05-04 DIAGNOSIS — Z1212 Encounter for screening for malignant neoplasm of rectum: Secondary | ICD-10-CM | POA: Diagnosis not present

## 2023-05-04 DIAGNOSIS — Z0000 Encounter for general adult medical examination without abnormal findings: Secondary | ICD-10-CM | POA: Diagnosis not present

## 2023-07-02 DIAGNOSIS — D125 Benign neoplasm of sigmoid colon: Secondary | ICD-10-CM | POA: Diagnosis not present

## 2023-07-02 DIAGNOSIS — K621 Rectal polyp: Secondary | ICD-10-CM | POA: Diagnosis not present

## 2023-07-02 DIAGNOSIS — K5730 Diverticulosis of large intestine without perforation or abscess without bleeding: Secondary | ICD-10-CM | POA: Diagnosis not present

## 2023-07-02 DIAGNOSIS — D128 Benign neoplasm of rectum: Secondary | ICD-10-CM | POA: Diagnosis not present

## 2023-07-02 DIAGNOSIS — Z1211 Encounter for screening for malignant neoplasm of colon: Secondary | ICD-10-CM | POA: Diagnosis not present

## 2024-01-08 DIAGNOSIS — L723 Sebaceous cyst: Secondary | ICD-10-CM | POA: Diagnosis not present

## 2024-01-08 DIAGNOSIS — L720 Epidermal cyst: Secondary | ICD-10-CM | POA: Diagnosis not present

## 2024-01-08 DIAGNOSIS — L989 Disorder of the skin and subcutaneous tissue, unspecified: Secondary | ICD-10-CM | POA: Diagnosis not present

## 2024-02-10 DIAGNOSIS — M25561 Pain in right knee: Secondary | ICD-10-CM | POA: Diagnosis not present

## 2024-02-11 DIAGNOSIS — M25561 Pain in right knee: Secondary | ICD-10-CM | POA: Diagnosis not present
